# Patient Record
Sex: Female | Born: 1960 | Race: White | Hispanic: No | Marital: Married | Smoking: Never smoker
Health system: Southern US, Community
[De-identification: ages and names within clinical notes are randomized; demographics above are authoritative.]

## PROBLEM LIST (undated history)

## (undated) DIAGNOSIS — K5790 Diverticulosis of intestine, part unspecified, without perforation or abscess without bleeding: Secondary | ICD-10-CM

## (undated) DIAGNOSIS — Z8489 Family history of other specified conditions: Secondary | ICD-10-CM

## (undated) DIAGNOSIS — E785 Hyperlipidemia, unspecified: Secondary | ICD-10-CM

## (undated) DIAGNOSIS — R7303 Prediabetes: Secondary | ICD-10-CM

## (undated) DIAGNOSIS — I10 Essential (primary) hypertension: Secondary | ICD-10-CM

## (undated) HISTORY — PX: WISDOM TOOTH EXTRACTION: SHX21

---

## 2012-05-28 ENCOUNTER — Ambulatory Visit: Payer: Self-pay | Admitting: Nurse Practitioner

## 2012-06-19 ENCOUNTER — Ambulatory Visit: Payer: Self-pay | Admitting: Nurse Practitioner

## 2012-06-20 ENCOUNTER — Ambulatory Visit: Payer: Self-pay | Admitting: Nurse Practitioner

## 2012-07-18 ENCOUNTER — Ambulatory Visit: Payer: Self-pay | Admitting: Nurse Practitioner

## 2014-02-18 ENCOUNTER — Ambulatory Visit: Payer: Self-pay | Admitting: Family Medicine

## 2014-03-21 ENCOUNTER — Ambulatory Visit: Payer: Self-pay | Admitting: Gastroenterology

## 2014-03-21 HISTORY — PX: COLONOSCOPY: SHX174

## 2016-02-27 ENCOUNTER — Other Ambulatory Visit: Payer: Self-pay | Admitting: Family Medicine

## 2016-02-27 DIAGNOSIS — Z1231 Encounter for screening mammogram for malignant neoplasm of breast: Secondary | ICD-10-CM

## 2016-03-25 ENCOUNTER — Ambulatory Visit
Admission: RE | Admit: 2016-03-25 | Discharge: 2016-03-25 | Disposition: A | Discharge status: Home / Self Care | Payer: 59 | Source: Ambulatory Visit | Attending: Family Medicine | Admitting: Family Medicine

## 2016-03-25 ENCOUNTER — Encounter (HOSPITAL_COMMUNITY): Payer: Self-pay

## 2016-03-25 DIAGNOSIS — Z1231 Encounter for screening mammogram for malignant neoplasm of breast: Secondary | ICD-10-CM | POA: Diagnosis not present

## 2017-03-03 ENCOUNTER — Other Ambulatory Visit: Payer: Self-pay | Admitting: Family Medicine

## 2017-03-03 DIAGNOSIS — Z1231 Encounter for screening mammogram for malignant neoplasm of breast: Secondary | ICD-10-CM

## 2017-03-26 ENCOUNTER — Ambulatory Visit
Admission: RE | Admit: 2017-03-26 | Discharge: 2017-03-26 | Disposition: A | Discharge status: Home / Self Care | Payer: 59 | Source: Ambulatory Visit | Attending: Family Medicine | Admitting: Family Medicine

## 2017-03-26 DIAGNOSIS — Z1231 Encounter for screening mammogram for malignant neoplasm of breast: Secondary | ICD-10-CM | POA: Insufficient documentation

## 2018-04-08 ENCOUNTER — Other Ambulatory Visit: Payer: Self-pay | Admitting: Family Medicine

## 2018-04-08 DIAGNOSIS — Z1231 Encounter for screening mammogram for malignant neoplasm of breast: Secondary | ICD-10-CM

## 2018-04-10 ENCOUNTER — Ambulatory Visit
Admission: RE | Admit: 2018-04-10 | Discharge: 2018-04-10 | Disposition: A | Discharge status: Home / Self Care | Payer: Managed Care, Other (non HMO) | Source: Ambulatory Visit | Attending: Family Medicine | Admitting: Family Medicine

## 2018-04-10 DIAGNOSIS — Z1231 Encounter for screening mammogram for malignant neoplasm of breast: Secondary | ICD-10-CM | POA: Diagnosis not present

## 2018-04-15 ENCOUNTER — Other Ambulatory Visit: Payer: Self-pay | Admitting: Family Medicine

## 2018-04-15 DIAGNOSIS — N6320 Unspecified lump in the left breast, unspecified quadrant: Secondary | ICD-10-CM

## 2018-04-15 DIAGNOSIS — R928 Other abnormal and inconclusive findings on diagnostic imaging of breast: Secondary | ICD-10-CM

## 2018-04-23 ENCOUNTER — Ambulatory Visit
Admission: RE | Admit: 2018-04-23 | Discharge: 2018-04-23 | Disposition: A | Discharge status: Home / Self Care | Payer: Managed Care, Other (non HMO) | Source: Ambulatory Visit | Attending: Family Medicine | Admitting: Family Medicine

## 2018-04-23 DIAGNOSIS — R928 Other abnormal and inconclusive findings on diagnostic imaging of breast: Secondary | ICD-10-CM | POA: Insufficient documentation

## 2018-04-23 DIAGNOSIS — N6320 Unspecified lump in the left breast, unspecified quadrant: Secondary | ICD-10-CM

## 2019-12-03 IMAGING — MG DIGITAL DIAGNOSTIC UNILATERAL LEFT MAMMOGRAM WITH TOMO AND CAD
4 series · 4 of 12 positions shown · non-contrast
Comparison: Previous exam(s).

ACR Breast Density Category a: The breast tissue is almost entirely
fatty.

CLINICAL DATA: 57-year-old female for further evaluation of
possible LEFT breast mass on screening mammogram

EXAM:
DIGITAL DIAGNOSTIC LEFT MAMMOGRAM WITH TOMO
ULTRASOUND LEFT BREAST

[L CC synth-2D]
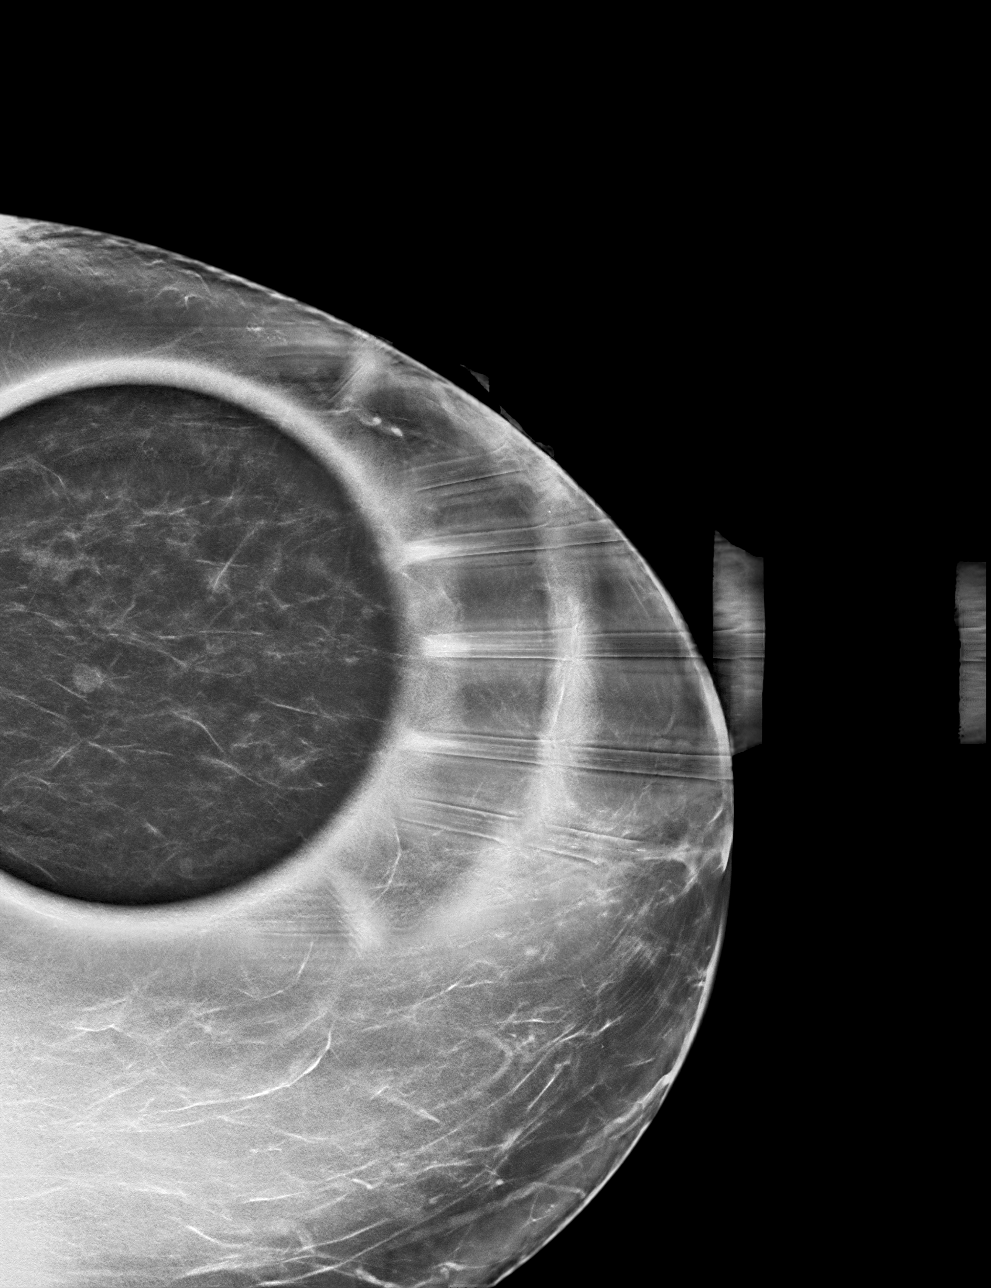

[L MLO synth-2D]
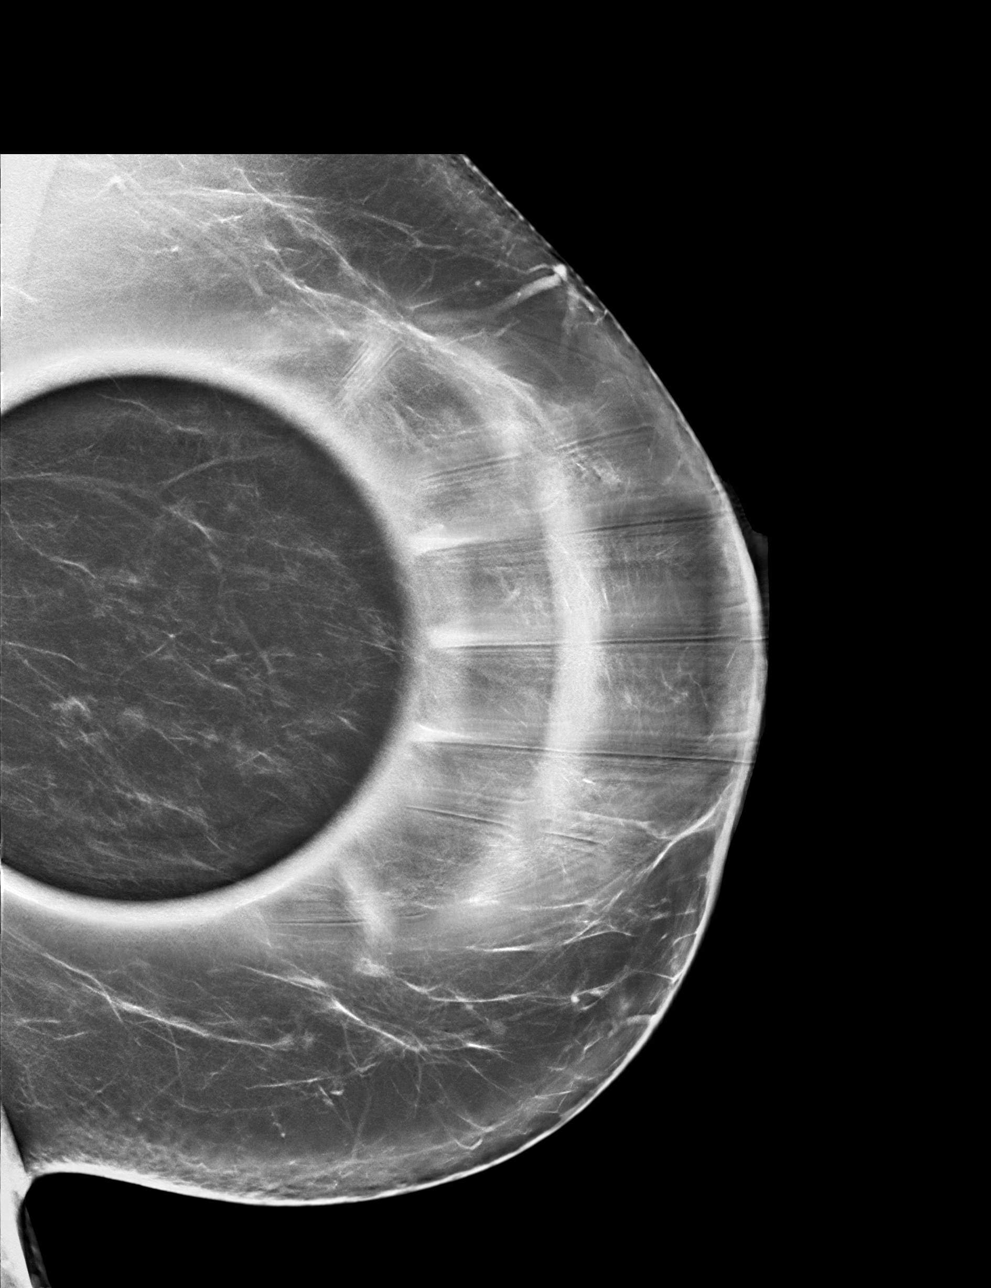

[L CC tomo · tomo slice 39/77.0]
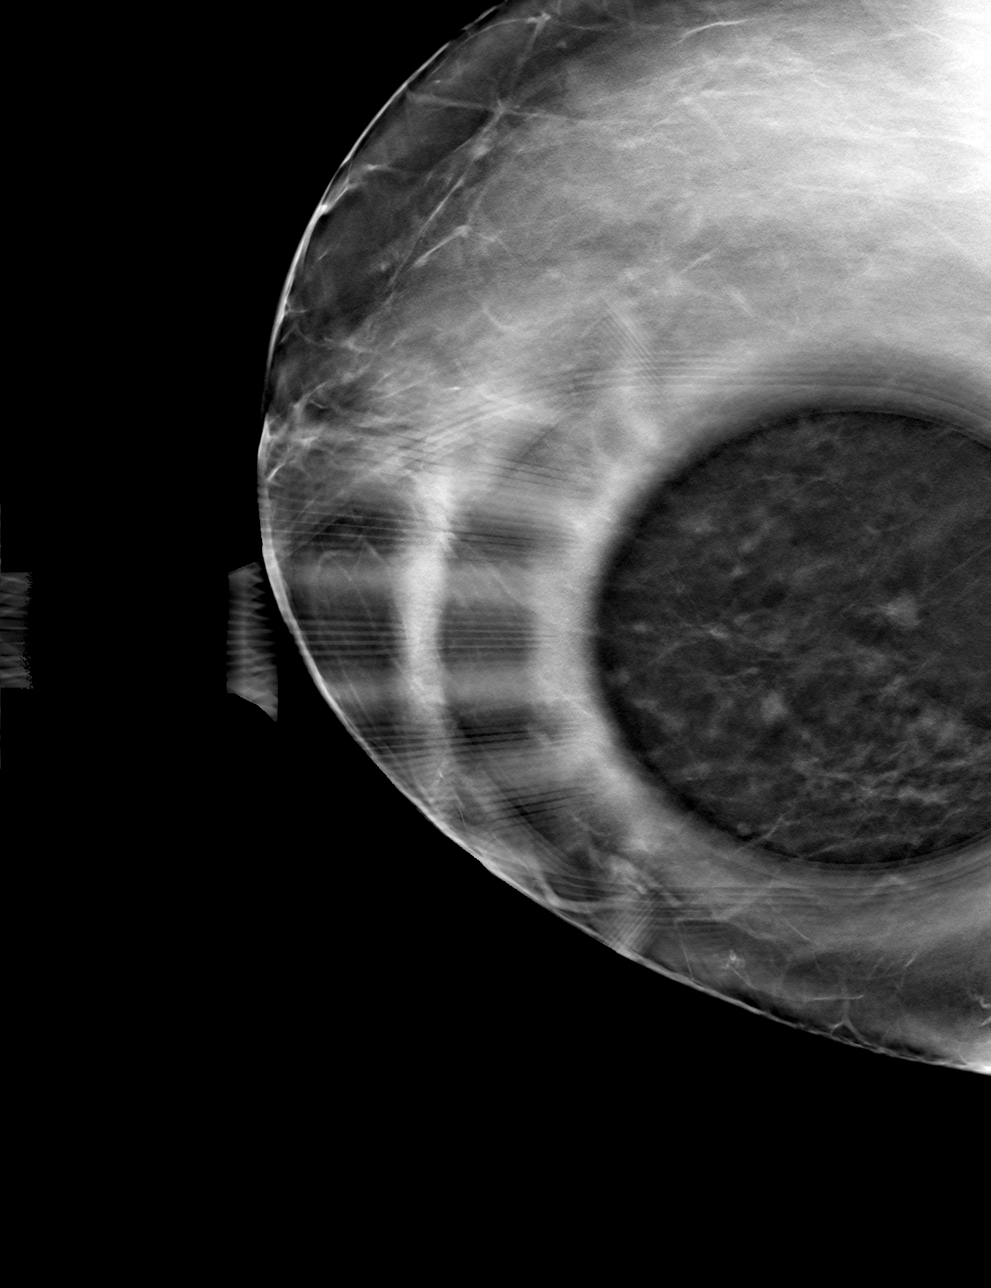

[L MLO tomo · tomo slice 41/80.0]
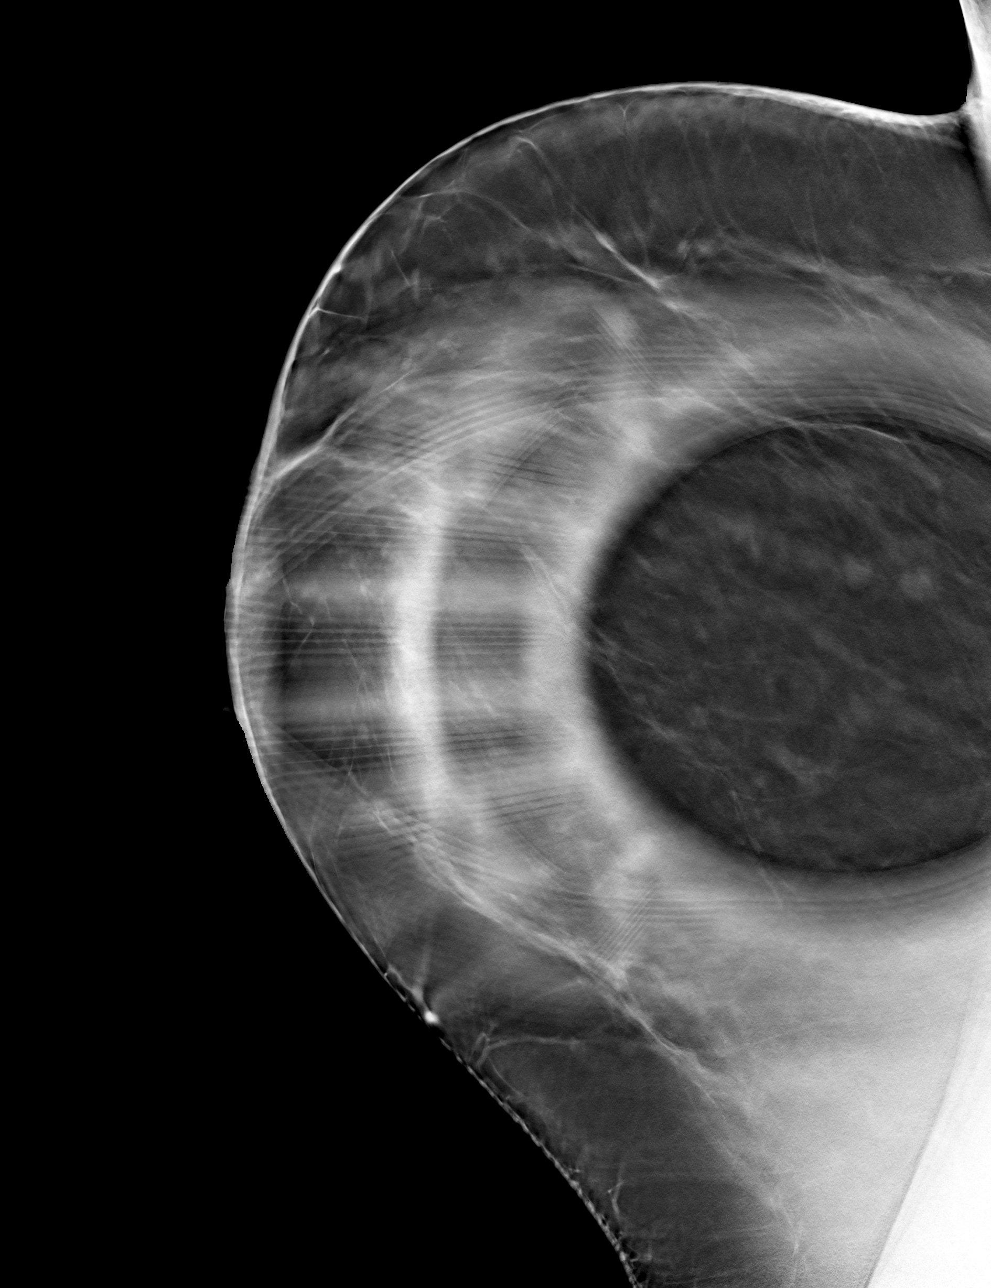

[4 of 12 positions shown; findings below may reference images not displayed]

FINDINGS: 2D/3D spot compression views of the LEFT breast demonstrate 2
adjacent circumscribed oval masses with possible fatty hila in the
OUTER LEFT breast.

Targeted ultrasound is performed, showing several benign appearing
intraparenchymal lymph nodes within the OUTER LEFT breast including
a 6 x 4 mm lymph node and a 4 x 3 mm lymph node at the 4 o'clock
position of the LEFT breast 6 cm from the nipple. No suspicious
solid mass, distortion or worrisome shadowing noted within the LEFT
breast.
IMPRESSION: Benign intraparenchymal lymph nodes within the OUTER LEFT breast,
likely representing the mammographic finding. Six-month follow-up
recommended to ensure stability.

RECOMMENDATION:
LEFT diagnostic mammogram with possible LEFT breast ultrasound in 6
months

I have discussed the findings and recommendations with the patient.
Results were also provided in writing at the conclusion of the
visit. If applicable, a reminder letter will be sent to the patient
regarding the next appointment.

BI-RADS CATEGORY  3: Probably benign.

## 2020-04-01 ENCOUNTER — Ambulatory Visit: Payer: Managed Care, Other (non HMO) | Attending: Internal Medicine

## 2020-04-01 DIAGNOSIS — Z23 Encounter for immunization: Secondary | ICD-10-CM

## 2020-04-01 NOTE — Progress Notes (Signed)
   Covid-19 Vaccination Clinic  Name:  Lisa Richards    MRN: [id number] DOB: [birth year]  04/01/2020  [name] was observed post Covid-19 immunization for 15 minutes without incident. She was provided with Vaccine Information Sheet and instruction to access the V-Safe system.   [name] was instructed to call 911 with any severe reactions post vaccine: Marland Kitchen Difficulty breathing  . Swelling of face and throat  . A fast heartbeat  . A bad rash all over body  . Dizziness and weakness   Immunizations Administered    Name Date Dose VIS Date Route   Pfizer COVID-19 Vaccine 04/01/2020 10:49 AM 0.3 mL 03/08/2020 Intramuscular   Manufacturer: [place]   Lot: [id number]   [place]: [id number]

## 2021-11-16 ENCOUNTER — Other Ambulatory Visit: Payer: Self-pay | Admitting: Family Medicine

## 2021-11-16 DIAGNOSIS — N63 Unspecified lump in unspecified breast: Secondary | ICD-10-CM

## 2021-12-11 ENCOUNTER — Ambulatory Visit
Admission: RE | Admit: 2021-12-11 | Discharge: 2021-12-11 | Disposition: A | Discharge status: Home / Self Care | Payer: Managed Care, Other (non HMO) | Source: Ambulatory Visit | Attending: Family Medicine | Admitting: Family Medicine

## 2021-12-11 DIAGNOSIS — N63 Unspecified lump in unspecified breast: Secondary | ICD-10-CM

## 2021-12-14 ENCOUNTER — Other Ambulatory Visit: Payer: Self-pay | Admitting: Family Medicine

## 2021-12-14 DIAGNOSIS — R928 Other abnormal and inconclusive findings on diagnostic imaging of breast: Secondary | ICD-10-CM

## 2021-12-14 DIAGNOSIS — N63 Unspecified lump in unspecified breast: Secondary | ICD-10-CM

## 2021-12-27 ENCOUNTER — Ambulatory Visit
Admission: RE | Admit: 2021-12-27 | Discharge: 2021-12-27 | Disposition: A | Discharge status: Home / Self Care | Payer: Managed Care, Other (non HMO) | Source: Ambulatory Visit | Attending: Family Medicine | Admitting: Family Medicine

## 2021-12-27 DIAGNOSIS — R928 Other abnormal and inconclusive findings on diagnostic imaging of breast: Secondary | ICD-10-CM

## 2021-12-27 DIAGNOSIS — N63 Unspecified lump in unspecified breast: Secondary | ICD-10-CM | POA: Diagnosis present

## 2021-12-27 HISTORY — PX: BREAST BIOPSY: SHX20

## 2021-12-28 LAB — SURGICAL PATHOLOGY

## 2021-12-31 ENCOUNTER — Encounter: Payer: Self-pay | Admitting: *Deleted

## 2021-12-31 NOTE — Progress Notes (Signed)
Referral recieved from [facility] for left breast atypical ductal hyperplasia.  Pt given several surgeon names and she will review and reach out to Dr. Baldemar Lenis for his recommendation.    I will call her tomorrow afternoon to see who she would like to be set up with.

## 2022-01-01 ENCOUNTER — Encounter: Payer: Self-pay | Admitting: *Deleted

## 2022-01-01 NOTE — Progress Notes (Signed)
Lisa Richards would like to see Dr. Bary Castilla, that appointment has been scheduled for 01/10/22 at 9:15.   She has been given appt. Details and instructions.   No further needs at this time.

## 2022-01-10 ENCOUNTER — Other Ambulatory Visit: Payer: Self-pay | Admitting: General Surgery

## 2022-01-10 DIAGNOSIS — N6321 Unspecified lump in the left breast, upper outer quadrant: Secondary | ICD-10-CM

## 2022-01-10 NOTE — Progress Notes (Signed)
Progress Notes - documented in this encounter Byrnett, Jeffrey William, MD - 01/10/2022 9:15 AM EDT Formatting of this note is different from the original. Subjective:   Patient ID: Lisa Richards is a 61 y.o. female.  HPI  The following portions of the patient's history were reviewed and updated as appropriate.  This a new patient is here today for: office visit. The patient has been referred by Dr. Babaoff for evaluation of atypical ductal hyperplasia in the left breast. Patient had a left breast biopsy done on 12-27-21. She reports the radiologist found a mass at the time of her 2019 mammogram and she decided to watch the area. Due to Covid she had a gap in mammograms between 2019 to 2023. Patient states she does self breast exams and denies any nipple discharge.   Chief Complaint  Patient presents with  Breast Mass    BP (!) 152/90  Pulse 108  Temp 36.1 C (97 F)  Ht 162.6 cm (5' 4")  Wt (!) 117.5 kg (259 lb)  SpO2 99%  BMI 44.46 kg/m   Past Medical History:  Diagnosis Date  Allergic state  BMI 45.0-49.9, adult (CMS-HCC) 05/19/12  Hypertension 11/13  Impaired fasting glucose 10/30/12  Menopausal state  Obesity    Past Surgical History:  Procedure Laterality Date  COLONOSCOPY 03/21/2014  Diverticulosis in the sigmoid colon/Repeat 10yrs/PYO  BREAST EXCISIONAL BIOPSY Left 12/27/2021  EXTRACTION TEETH    OB History   Gravida  2  Para  2  Term   Preterm   AB   Living     SAB   IAB   Ectopic   Molar   Multiple   Live Births     Obstetric Comments  Age at first period 13 Age of first pregnancy 30      Social History   Socioeconomic History  Marital status: Married  Tobacco Use  Smoking status: Never  Smokeless tobacco: Never  Substance and Sexual Activity  Alcohol use: Yes  Alcohol/week: 1.0 standard drink  Types: 1 Cans of beer per week  Comment: occasionally  Drug use: No  Sexual activity: Yes  Partners: Male  Birth  control/protection: Post-menopausal    Allergies  Allergen Reactions  Hydrocodone Hives, Itching and Swelling   Current Outpatient Medications  Medication Sig Dispense Refill  atorvastatin (LIPITOR) 10 MG tablet TAKE 1 TABLET BY MOUTH EVERY DAY 90 tablet 1  cetirizine (ZYRTEC) 10 MG tablet Take 10 mg by mouth once daily.  fluticasone propionate (FLONASE) 50 mcg/actuation nasal spray Place 2 sprays into both nostrils once daily  losartan (COZAAR) 25 MG tablet TAKE 1 TABLET BY MOUTH EVERY DAY 90 tablet 1  metoprolol-hydroCHLOROthiazide (LOPRESSOR HCT) 100-25 mg tablet TAKE 1 TABLET BY MOUTH EVERY DAY 90 tablet 1  olopatadine (PATADAY) 0.2 % ophthalmic solution Place 1 drop into both eyes once daily   No current facility-administered medications for this visit.   Family History  Problem Relation Age of Onset  Ovarian cancer Mother 77  Coronary Artery Disease (Blocked arteries around heart) Father  High blood pressure (Hypertension) Father  Cancer Sister  skin cancer  Coronary Artery Disease (Blocked arteries around heart) Maternal Grandmother  Coronary Artery Disease (Blocked arteries around heart) Maternal Grandfather  COPD Paternal Grandmother  Colon cancer Paternal Grandfather  Breast cancer Neg Hx   Labs and Radiology:   Left breast biopsy December 27, 2021:  A. BREAST, LEFT 2:00 10 CM FN; ULTRASOUND-GUIDED BIOPSY:  - ATYPICAL FIBROEPITHELIAL LESION, SEE COMMENT.   Comment:    Sections demonstrate cores of a fibroepithelial lesion with atypical  epithelial proliferation, pericanalicular architecture, and moderately  cellular fibrous stroma. IHC for CK5/6 and ER was performed. The  atypical epithelial cells are negative for CK5/6 with retained ER  expression.  The differential diagnosis for these findings includes atypical ductal  hyperplasia (ADH) involving a cellular fibroadenoma.   Imaging review:  April 10, 2018 through December 27, 2021 imaging studies were  reviewed. Small interval change over 3-1/2 years in the left upper outer quadrant lesion.  November 13, 2021 laboratory:  Globulin, Total - [facility] 1.5 - 4.5 g/dL 2.4  A/G Ratio - [facility] 1.2 - 2.2 1.9  Bilirubin Total - [facility] 0.0 - 1.2 mg/dL 0.4  Alkaline Phosphatase - [facility] 44 - 121 IU/L 91  AST (SGOT) - [facility] 0 - 40 IU/L 16  ALT (SGPT) - [facility] 0 - 32 IU/L 22  Cholesterol, Total - [facility] 100 - 199 mg/dL 171  Triglycerides - [facility] 0 - 149 mg/dL 182 High  Hdl Cholesterol - [facility] >39 mg/dL 45  VLDL Cholesterol Cal - [facility] 5 - 40 mg/dL 31  Low Density Lipoprotein - [facility] 0 - 99 mg/dL 95  LDL/HDL Ratio - [facility] 0.0 - 3.2 ratio 2.1   Glucose Random - [facility] 70 - 99 mg/dL 126 High  Blood Urea Nitrogen - [facility] 8 - 27 mg/dL 18  Creatinine - [facility] 0.57 - 1.00 mg/dL 0.91  EGFR (CKD-EPI 2021) - [facility] >59 mL/min/1.73 72  Bun/Creatinine Ratio - [facility] 12 - 28 20  Sodium - [facility] 134 - 144 mmol/L 141  Potassium - [facility] 3.5 - 5.2 mmol/L 4.5  Chloride - [facility] 96 - 106 mmol/L 99  Carbon Dioxide - [facility] 20 - 29 mmol/L 25  Calcium - [facility] 8.7 - 10.3 mg/dL 10.9 High  Protein Total - [facility] 6.0 - 8.5 g/dL 6.9  Albumin - [facility] 3.8 - 4.8 g/dL 4.5   TSH - [facility] 0.450 - 4.500 uIU/mL 0.664  T4, Total - [facility] 4.5 - 12.0 ug/dL 8.8  T3 Uptake Ratio - [facility] 24 - 39 % 24  Free Thyroxine Index (DMC) - [facility] 1.2 - 4.9 2.1  White Blood Cell Count - [facility] 3.4 - 10.8 x10E3/uL 8.7  Red Blood Cell Count - [facility] 3.77 - 5.28 x10E6/uL 4.86  Hemoglobin - [facility] 11.1 - 15.9 g/dL 14.1  Hematocrit - [facility] 34.0 - 46.6 % 42.2  MCV - [facility] 79 - 97 fL 87  MCH - [facility] 26.6 - 33.0 pg 29.0  MCHC - [facility] 31.5 - 35.7 g/dL 33.4  RDW - [facility] 11.7 - 15.4 % 12.8  Platelets - [facility] 150 - 450 x10E3/uL 291   January 10, 2022 left breast ultrasound:  The biopsy site is not clearly identified.   Review of Systems  Constitutional: Negative for chills and fever.   Respiratory: Negative for cough.    Objective:  Physical Exam Exam conducted with a chaperone present.  Constitutional:  Appearance: Normal appearance.  Cardiovascular:  Rate and Rhythm: Normal rate and regular rhythm.  Pulses: Normal pulses.  Heart sounds: Normal heart sounds.  Pulmonary:  Effort: Pulmonary effort is normal.  Breath sounds: Normal breath sounds.  Musculoskeletal:  Cervical back: Neck supple.  Skin: General: Skin is warm and dry.  Neurological:  Mental Status: She is alert and oriented to person, place, and time.  Psychiatric:  Mood and Affect: Mood normal.  Behavior: Behavior normal.    Assessment:   Small lesion within the left breast that   has shown minimal change over the last 3.5 years. ADH on biopsy.  Plan:   Indication for excision to evaluate for potential upstaging to DCIS, less likely invasive mammary carcinoma.  Has the area is not clearly identifiable on ultrasound today, wire localization would be appropriate.   This note is partially prepared by Michele Bailey, CMA acting as a scribe in the presence of Dr. Jeffrey Byrnett, MD.   The documentation recorded by the scribe accurately reflects the service I personally performed and the decisions made by me.   Jeffrey W. Byrnett, MD FACS   Electronically signed by Byrnett, Jeffrey William, MD at 01/10/2022 8:25 PM EDT 

## 2022-01-14 ENCOUNTER — Other Ambulatory Visit: Payer: Self-pay | Admitting: General Surgery

## 2022-01-14 DIAGNOSIS — N6321 Unspecified lump in the left breast, upper outer quadrant: Secondary | ICD-10-CM

## 2022-01-15 ENCOUNTER — Other Ambulatory Visit: Payer: Self-pay | Admitting: Family Medicine

## 2022-01-24 ENCOUNTER — Encounter
Admission: RE | Admit: 2022-01-24 | Discharge: 2022-01-24 | Disposition: A | Discharge status: Home / Self Care | Payer: Managed Care, Other (non HMO) | Source: Ambulatory Visit | Attending: General Surgery | Admitting: General Surgery

## 2022-01-24 VITALS — Ht 64.0 in | Wt 256.0 lb

## 2022-01-24 DIAGNOSIS — Z01812 Encounter for preprocedural laboratory examination: Secondary | ICD-10-CM

## 2022-01-24 DIAGNOSIS — I10 Essential (primary) hypertension: Secondary | ICD-10-CM

## 2022-01-24 HISTORY — DX: Essential (primary) hypertension: I10

## 2022-01-24 HISTORY — DX: Prediabetes: R73.03

## 2022-01-24 HISTORY — DX: Hyperlipidemia, unspecified: E78.5

## 2022-01-24 HISTORY — DX: Family history of other specified conditions: Z84.89

## 2022-01-24 HISTORY — DX: Diverticulosis of intestine, part unspecified, without perforation or abscess without bleeding: K57.90

## 2022-01-24 HISTORY — DX: Morbid (severe) obesity due to excess calories: E66.01

## 2022-01-24 NOTE — Patient Instructions (Addendum)
Your procedure is scheduled on: Monday, September 11 Report to the Registration Desk on the 1st floor of the [facility] at 8 am or the time your were given by Radiology Dept.  REMEMBER: Instructions that are not followed completely may result in serious medical risk, up to and including death; or upon the discretion of your surgeon and anesthesiologist your surgery may need to be rescheduled.  Do not eat food after midnight the night before surgery.  No gum chewing, lozengers or hard candies.  You may however, drink CLEAR liquids up to 2 hours before you are scheduled to arrive for your surgery. Do not drink anything within 2 hours of your scheduled arrival time.  Clear liquids include: - water  - apple juice without pulp - gatorade (not RED colors) - black coffee or tea (Do NOT add milk or creamers to the coffee or tea) Do NOT drink anything that is not on this list.  DO NOT TAKE ANY MEDICATIONS THE MORNING OF SURGERY  One week prior to surgery: starting today, September 7 Stop Anti-inflammatories (NSAIDS) such as Advil, Aleve, Ibuprofen, Motrin, Naproxen, Naprosyn and Aspirin based products such as Excedrin, Goodys Powder, BC Powder. Stop ANY OVER THE COUNTER supplements until after surgery. You may however, continue to take Tylenol if needed for pain up until the day of surgery.  No Alcohol for 24 hours before or after surgery.  No Smoking including e-cigarettes for 24 hours prior to surgery.  No chewable tobacco products for at least 6 hours prior to surgery.  No nicotine patches on the day of surgery.  Do not use any "recreational" drugs for at least a week prior to your surgery.  Please be advised that the combination of cocaine and anesthesia may have negative outcomes, up to and including death. If you test positive for cocaine, your surgery will be cancelled.  On the morning of surgery brush your teeth with toothpaste and water, you may rinse your mouth with mouthwash if  you wish. Do not swallow any toothpaste or mouthwash.  Use CHG Soap as directed on instruction sheet.  Do not wear jewelry, make-up, hairpins, clips or nail polish.  Do not wear lotions, powders, or perfumes.   Do not shave body from the neck down 48 hours prior to surgery just in case you cut yourself which could leave a site for infection.  Also, freshly shaved skin may become irritated if using the CHG soap.  Contact lenses, hearing aids and dentures may not be worn into surgery.  Do not bring valuables to the hospital. [facility] is not responsible for any missing/lost belongings or valuables.   Notify your doctor if there is any change in your medical condition (cold, fever, infection).  Wear comfortable clothing (specific to your surgery type) to the hospital.  After surgery, you can help prevent lung complications by doing breathing exercises.  Take deep breaths and cough every 1-2 hours. Your doctor may order a device called an Incentive Spirometer to help you take deep breaths.  If you are being discharged the day of surgery, you will not be allowed to drive home. You will need a responsible adult (18 years or older) to drive you home and stay with you that night.   If you are taking public transportation, you will need to have a responsible adult (18 years or older) with you. Please confirm with your physician that it is acceptable to use public transportation.   Please call the [facility] Dept.  at [phone] if you have any questions about these instructions.  Surgery Visitation Policy:  Patients undergoing a surgery or procedure may have two family members or support persons with them as long as the person is not COVID-19 positive or experiencing its symptoms.      Preparing for Surgery with [place] (CHG) Soap  Before surgery, you can play an important role by reducing the number of germs on your skin.  CHG (Chlorhexidine  gluconate) soap is an antiseptic cleanser which kills germs and bonds with the skin to continue killing germs even after washing.  Please do not use if you have an allergy to CHG or antibacterial soaps. If your skin becomes reddened/irritated stop using the CHG.  1. Shower the NIGHT BEFORE SURGERY and the MORNING OF SURGERY with CHG soap.  2. If you choose to wash your hair, wash your hair first as usual with your normal shampoo.  3. After shampooing, rinse your hair and body thoroughly to remove the shampoo.  4. Use CHG as you would any other liquid soap. You can apply CHG directly to the skin and wash gently with a scrungie or a clean washcloth.  5. Apply the CHG soap to your body only from the neck down. Do not use on open wounds or open sores. Avoid contact with your eyes, ears, mouth, and genitals (private parts). Wash face and genitals (private parts) with your normal soap.  6. Wash thoroughly, paying special attention to the area where your surgery will be performed.  7. Thoroughly rinse your body with warm water.  8. Do not shower/wash with your normal soap after using and rinsing off the CHG soap.  9. Pat yourself dry with a clean towel.  10. Wear clean pajamas to bed the night before surgery.  12. Place clean sheets on your bed the night of your first shower and do not sleep with pets.  13. Shower again with the CHG soap on the day of surgery prior to arriving at the hospital.  14. Do not apply any deodorants/lotions/powders.  15. Please wear clean clothes to the hospital.

## 2022-01-25 ENCOUNTER — Encounter: Payer: Self-pay | Admitting: Urgent Care

## 2022-01-25 ENCOUNTER — Encounter
Admission: RE | Admit: 2022-01-25 | Discharge: 2022-01-25 | Disposition: A | Discharge status: Home / Self Care | Payer: Managed Care, Other (non HMO) | Source: Ambulatory Visit | Attending: General Surgery

## 2022-01-25 DIAGNOSIS — I10 Essential (primary) hypertension: Secondary | ICD-10-CM | POA: Diagnosis not present

## 2022-01-25 DIAGNOSIS — Z01818 Encounter for other preprocedural examination: Secondary | ICD-10-CM | POA: Insufficient documentation

## 2022-01-25 DIAGNOSIS — Z01812 Encounter for preprocedural laboratory examination: Secondary | ICD-10-CM

## 2022-01-25 LAB — POTASSIUM: Potassium: 4.1 mmol/L (ref 3.5–5.1)

## 2022-01-28 ENCOUNTER — Ambulatory Visit: Payer: Managed Care, Other (non HMO) | Admitting: Certified Registered"

## 2022-01-28 ENCOUNTER — Encounter: Payer: Self-pay | Admitting: General Surgery

## 2022-01-28 ENCOUNTER — Ambulatory Visit
Admission: RE | Admit: 2022-01-28 | Discharge: 2022-01-28 | Disposition: A | Discharge status: Home / Self Care | Payer: Managed Care, Other (non HMO) | Source: Ambulatory Visit | Attending: General Surgery | Admitting: General Surgery

## 2022-01-28 ENCOUNTER — Other Ambulatory Visit: Payer: Self-pay

## 2022-01-28 ENCOUNTER — Encounter
Admission: RE | Disposition: A | Discharge status: Home / Self Care | Payer: Self-pay | Source: Ambulatory Visit | Attending: General Surgery

## 2022-01-28 ENCOUNTER — Other Ambulatory Visit: Payer: Self-pay | Admitting: General Surgery

## 2022-01-28 DIAGNOSIS — R7303 Prediabetes: Secondary | ICD-10-CM | POA: Diagnosis not present

## 2022-01-28 DIAGNOSIS — D242 Benign neoplasm of left breast: Secondary | ICD-10-CM | POA: Diagnosis not present

## 2022-01-28 DIAGNOSIS — I10 Essential (primary) hypertension: Secondary | ICD-10-CM | POA: Diagnosis not present

## 2022-01-28 DIAGNOSIS — N6321 Unspecified lump in the left breast, upper outer quadrant: Secondary | ICD-10-CM

## 2022-01-28 DIAGNOSIS — E785 Hyperlipidemia, unspecified: Secondary | ICD-10-CM | POA: Insufficient documentation

## 2022-01-28 DIAGNOSIS — N6092 Unspecified benign mammary dysplasia of left breast: Secondary | ICD-10-CM | POA: Insufficient documentation

## 2022-01-28 DIAGNOSIS — Z6841 Body Mass Index (BMI) 40.0 and over, adult: Secondary | ICD-10-CM | POA: Diagnosis not present

## 2022-01-28 HISTORY — PX: BREAST EXCISIONAL BIOPSY: SUR124

## 2022-01-28 HISTORY — PX: BREAST BIOPSY: SHX20

## 2022-01-28 SURGERY — BREAST BIOPSY WITH NEEDLE LOCALIZATION
Anesthesia: General | Laterality: Left

## 2022-01-28 MED ORDER — DEXAMETHASONE SODIUM PHOSPHATE 10 MG/ML IJ SOLN
INTRAMUSCULAR | Status: AC
  Filled 2022-01-28: qty 1

## 2022-01-28 MED ORDER — FENTANYL CITRATE (PF) 100 MCG/2ML IJ SOLN
INTRAMUSCULAR | Status: DC | PRN
  Administered 2022-01-28 (×2): 50 ug via INTRAVENOUS

## 2022-01-28 MED ORDER — PHENYLEPHRINE HCL-NACL 20-0.9 MG/250ML-% IV SOLN
INTRAVENOUS | Status: DC | PRN
  Administered 2022-01-28: 50 ug/min via INTRAVENOUS

## 2022-01-28 MED ORDER — ONDANSETRON HCL 4 MG/2ML IJ SOLN
INTRAMUSCULAR | Status: DC | PRN
  Administered 2022-01-28: 4 mg via INTRAVENOUS

## 2022-01-28 MED ORDER — LACTATED RINGERS IV SOLN: INTRAVENOUS | Status: DC

## 2022-01-28 MED ORDER — PROPOFOL 10 MG/ML IV BOLUS
INTRAVENOUS | Status: AC
  Filled 2022-01-28: qty 20

## 2022-01-28 MED ORDER — PROPOFOL 10 MG/ML IV BOLUS
INTRAVENOUS | Status: DC | PRN
  Administered 2022-01-28: 150 mg via INTRAVENOUS

## 2022-01-28 MED ORDER — MIDAZOLAM HCL 2 MG/2ML IJ SOLN
INTRAMUSCULAR | Status: DC | PRN
  Administered 2022-01-28: 2 mg via INTRAVENOUS

## 2022-01-28 MED ORDER — PHENYLEPHRINE HCL (PRESSORS) 10 MG/ML IV SOLN
INTRAVENOUS | Status: AC
  Filled 2022-01-28: qty 1

## 2022-01-28 MED ORDER — BUPIVACAINE-EPINEPHRINE (PF) 0.5% -1:200000 IJ SOLN
INTRAMUSCULAR | Status: AC
  Filled 2022-01-28: qty 30

## 2022-01-28 MED ORDER — CHLORHEXIDINE GLUCONATE 0.12 % MT SOLN
OROMUCOSAL | Status: AC
  Administered 2022-01-28: 15 mL via OROMUCOSAL
  Filled 2022-01-28: qty 15

## 2022-01-28 MED ORDER — DEXAMETHASONE SODIUM PHOSPHATE 10 MG/ML IJ SOLN
INTRAMUSCULAR | Status: DC | PRN
  Administered 2022-01-28: 10 mg via INTRAVENOUS

## 2022-01-28 MED ORDER — CHLORHEXIDINE GLUCONATE CLOTH 2 % EX PADS: 6.0000 | MEDICATED_PAD | Freq: Once | CUTANEOUS | Status: DC

## 2022-01-28 MED ORDER — CHLORHEXIDINE GLUCONATE 0.12 % MT SOLN: 15.0000 mL | Freq: Once | OROMUCOSAL | Status: AC

## 2022-01-28 MED ORDER — LIDOCAINE HCL (PF) 2 % IJ SOLN
INTRAMUSCULAR | Status: AC
  Filled 2022-01-28: qty 5

## 2022-01-28 MED ORDER — BUPIVACAINE-EPINEPHRINE (PF) 0.5% -1:200000 IJ SOLN
INTRAMUSCULAR | Status: DC | PRN
  Administered 2022-01-28: 20 mL

## 2022-01-28 MED ORDER — KETAMINE HCL 50 MG/5ML IJ SOSY
PREFILLED_SYRINGE | INTRAMUSCULAR | Status: AC
  Filled 2022-01-28: qty 5

## 2022-01-28 MED ORDER — FENTANYL CITRATE (PF) 100 MCG/2ML IJ SOLN: 25.0000 ug | INTRAMUSCULAR | Status: DC | PRN

## 2022-01-28 MED ORDER — MIDAZOLAM HCL 2 MG/2ML IJ SOLN
INTRAMUSCULAR | Status: AC
  Filled 2022-01-28: qty 2

## 2022-01-28 MED ORDER — ONDANSETRON HCL 4 MG/2ML IJ SOLN
INTRAMUSCULAR | Status: AC
  Filled 2022-01-28: qty 2

## 2022-01-28 MED ORDER — TRAMADOL HCL 50 MG PO TABS: 50.0000 mg | ORAL_TABLET | Freq: Four times a day (QID) | ORAL | 0 refills | Status: AC | PRN

## 2022-01-28 MED ORDER — PHENYLEPHRINE HCL (PRESSORS) 10 MG/ML IV SOLN
INTRAVENOUS | Status: DC | PRN
  Administered 2022-01-28: 40 ug via INTRAVENOUS
  Administered 2022-01-28: 80 ug via INTRAVENOUS
  Administered 2022-01-28: 160 ug via INTRAVENOUS

## 2022-01-28 MED ORDER — FAMOTIDINE 20 MG PO TABS
ORAL_TABLET | ORAL | Status: AC
  Administered 2022-01-28: 20 mg via ORAL
  Filled 2022-01-28: qty 1

## 2022-01-28 MED ORDER — KETAMINE HCL 10 MG/ML IJ SOLN
INTRAMUSCULAR | Status: DC | PRN
  Administered 2022-01-28: 20 mg via INTRAVENOUS
  Administered 2022-01-28: 30 mg via INTRAVENOUS

## 2022-01-28 MED ORDER — FAMOTIDINE 20 MG PO TABS: 20.0000 mg | ORAL_TABLET | Freq: Once | ORAL | Status: AC

## 2022-01-28 MED ORDER — FENTANYL CITRATE (PF) 100 MCG/2ML IJ SOLN
INTRAMUSCULAR | Status: AC
  Filled 2022-01-28: qty 2

## 2022-01-28 MED ORDER — ORAL CARE MOUTH RINSE: 15.0000 mL | Freq: Once | OROMUCOSAL | Status: AC

## 2022-01-28 SURGICAL SUPPLY — 44 items
APL PRP STRL LF DISP 70% ISPRP (MISCELLANEOUS) ×1
BLADE BOVIE TIP EXT 4 (BLADE) IMPLANT
BLADE SURG 15 STRL SS SAFETY (BLADE) ×2 IMPLANT
CHLORAPREP W/TINT 26 (MISCELLANEOUS) ×2 IMPLANT
CNTNR SPEC 2.5X3XGRAD LEK (MISCELLANEOUS)
CONT SPEC 4OZ STER OR WHT (MISCELLANEOUS)
CONT SPEC 4OZ STRL OR WHT (MISCELLANEOUS)
CONTAINER SPEC 2.5X3XGRAD LEK (MISCELLANEOUS) IMPLANT
COVER PROBE FLX POLY STRL (MISCELLANEOUS) ×1 IMPLANT
DEVICE DUBIN SPECIMEN MAMMOGRA (MISCELLANEOUS) ×1 IMPLANT
DRAPE LAPAROTOMY 100X77 ABD (DRAPES) ×1 IMPLANT
DRSG GAUZE FLUFF 36X18 (GAUZE/BANDAGES/DRESSINGS) ×1 IMPLANT
DRSG TELFA 3X4 N-ADH STERILE (GAUZE/BANDAGES/DRESSINGS) ×2 IMPLANT
ELECT CAUTERY BLADE TIP 2.5 (TIP) ×1
ELECT REM PT RETURN 9FT ADLT (ELECTROSURGICAL) ×1
ELECTRODE CAUTERY BLDE TIP 2.5 (TIP) ×1 IMPLANT
ELECTRODE REM PT RTRN 9FT ADLT (ELECTROSURGICAL) ×1 IMPLANT
GAUZE 4X4 16PLY ~~LOC~~+RFID DBL (SPONGE) ×1 IMPLANT
GLOVE BIO SURGEON STRL SZ7.5 (GLOVE) ×1 IMPLANT
GLOVE SURG UNDER LTX SZ8 (GLOVE) ×1 IMPLANT
GOWN STRL REUS W/ TWL LRG LVL3 (GOWN DISPOSABLE) ×2 IMPLANT
GOWN STRL REUS W/TWL LRG LVL3 (GOWN DISPOSABLE) ×2
KIT TURNOVER KIT A (KITS) ×1 IMPLANT
LABEL OR SOLS (LABEL) ×1 IMPLANT
MANIFOLD NEPTUNE II (INSTRUMENTS) ×1 IMPLANT
MARGIN MAP 10MM (MISCELLANEOUS) ×1 IMPLANT
NDL HYPO 25X1 1.5 SAFETY (NEEDLE) ×1 IMPLANT
NEEDLE HYPO 22GX1.5 SAFETY (NEEDLE) ×1 IMPLANT
NEEDLE HYPO 25X1 1.5 SAFETY (NEEDLE) ×1 IMPLANT
PACK BASIN MINOR ARMC (MISCELLANEOUS) ×1 IMPLANT
RETRACTOR RING XSMALL (MISCELLANEOUS) IMPLANT
RTRCTR WOUND ALEXIS 13CM XS SH (MISCELLANEOUS)
STRIP CLOSURE SKIN 1/2X4 (GAUZE/BANDAGES/DRESSINGS) ×1 IMPLANT
SUT ETHILON 3-0 FS-10 30 BLK (SUTURE) ×1
SUT VIC AB 2-0 CT1 27 (SUTURE) ×1
SUT VIC AB 2-0 CT1 TAPERPNT 27 (SUTURE) ×1 IMPLANT
SUT VIC AB 4-0 FS2 27 (SUTURE) ×1 IMPLANT
SUTURE EHLN 3-0 FS-10 30 BLK (SUTURE) ×1 IMPLANT
SWABSTK COMLB BENZOIN TINCTURE (MISCELLANEOUS) ×1 IMPLANT
SYR 10ML LL (SYRINGE) ×1 IMPLANT
TAPE TRANSPORE STRL 2 31045 (GAUZE/BANDAGES/DRESSINGS) IMPLANT
TRAP FLUID SMOKE EVACUATOR (MISCELLANEOUS) ×1 IMPLANT
WATER STERILE IRR 1000ML POUR (IV SOLUTION) ×1 IMPLANT
WATER STERILE IRR 500ML POUR (IV SOLUTION) ×1 IMPLANT

## 2022-01-28 NOTE — OR Nursing (Signed)
PER OR RM 8/TERRY, PER DR. BYRNETT, HE DOES NOT NEED TO SEE PT IN POSTOP PRIOR TO DISCHARGE.

## 2022-01-28 NOTE — Anesthesia Postprocedure Evaluation (Signed)
Anesthesia Post Note  Patient: Lisa Richards  Procedure(s) Performed: BREAST BIOPSY WITH NEEDLE LOCALIZATION (Left)  Patient location during evaluation: PACU Anesthesia Type: General Level of consciousness: awake and alert Pain management: pain level controlled Vital Signs Assessment: post-procedure vital signs reviewed and stable Respiratory status: spontaneous breathing, nonlabored ventilation, respiratory function stable and patient connected to nasal cannula oxygen Cardiovascular status: blood pressure returned to baseline and stable Postop Assessment: no apparent nausea or vomiting Anesthetic complications: no   No notable events documented.   Last Vitals:  Vitals:   01/28/22 1200 01/28/22 1215  BP: 111/64   Pulse: 98   Resp: 12   Temp:  (!) 36.1 C  SpO2: 95%     Last Pain:  Vitals:   01/28/22 1215  TempSrc:   PainSc: 0-No pain                 Precious Haws Ranae Casebier

## 2022-01-28 NOTE — Op Note (Signed)
Preoperative diagnosis: Atypical ductal hyperplasia of the left breast.  Postoperative diagnosis: Same.  Operative procedure: Left breast biopsy with wire localization.  Operating surgeon: Hervey Ard, MD.  Anesthesia: General by LMA, Marcaine 0.5% with 1: 200,000 units of epinephrine, 30 cc.  Estimated blood loss: 5 cc.  Clinical note: 61 year old woman had a longstanding lesion in the left breast recently biopsied showing evidence of ADH.  She is admitted at this time for biopsy to confirm no upstaging.  The patient underwent wire localization the morning of the procedure.  The case was reviewed with the radiologist by phone prior to surgery.  Antibiotics were not indicated.  SCD stockings for DVT prevention.  Operative note: The breast was taped medially to provide exposure of the upper outer quadrant.  With the patient under adequate general anesthesia the breast was cleansed with ChloraPrep and draped.  Attempts at ultrasound localization of the wire and documentation were unsuccessful due to plant printer failure.  Marcaine was infiltrated for postoperative analgesia.  A curvilinear incision in the upper outer quadrant of the breast was undertaken.  The skin was incised sharply remaining dissection with electrocautery.  The wire was brought into the wound.  The tissue deep to the wire was swept medially and a 2 x 3 x 6 cm block of tissue excised orientated and specimen radiograph obtained.  This showed the intact wire tip as well as the previously placed clip.  There was no clear-cut palpable mass, rather just some focal thickening which was thought to be breast parenchyma.  Hemostasis was electrocautery.  The deep tissue was approximated with 2 layers of 2-0 Vicryl figure-of-eight sutures.  The skin was closed with a running 4-0 Vicryl subcuticular suture.  Benzoin and Steri-Strips followed by Telfa, fluff gauze and a compressive wrap were applied.  Patient tolerated procedure well and  was taken to the PACU in stable condition.

## 2022-01-28 NOTE — H&P (Signed)
Lisa Richards [id number] 12/10/1960     HPI:  Patient with long standing lesion showing ADH.  For excisional biopsy.   Medications Prior to Admission  Medication Sig Dispense Refill Last Dose   atorvastatin (LIPITOR) 10 MG tablet Take 10 mg by mouth at bedtime.   01/27/2022   cetirizine (ZYRTEC) 10 MG tablet Take 10 mg by mouth daily.   01/27/2022   fluticasone (FLONASE) 50 MCG/ACT nasal spray Place 2 sprays into both nostrils daily.   01/27/2022   losartan (COZAAR) 25 MG tablet Take 25 mg by mouth at bedtime.   01/27/2022   metoprolol-hydrochlorothiazide (LOPRESSOR HCT) 100-25 MG tablet Take 1 tablet by mouth at bedtime.   01/27/2022   olopatadine (PATANOL) 0.1 % ophthalmic solution Place 1 drop into both eyes daily.   01/27/2022   Allergies  Allergen Reactions   Hydrocodone Hives   Past Medical History:  Diagnosis Date   Diverticulosis    Family history of adverse reaction to anesthesia    mother and sister had N/V   Hyperlipidemia    Hypertension    Morbid obesity with BMI of 40.0-44.9, adult (HCC)    Pre-diabetes    Past Surgical History:  Procedure Laterality Date   BREAST BIOPSY Left 12/27/2021   [place] Bx, Ribbon Clip, Path Pending   COLONOSCOPY  03/21/2014   WISDOM TOOTH EXTRACTION     Social History   Socioeconomic History   Marital status: Married    Spouse name: Lanny Hurst   Number of children: 2   Years of education: Not on file   Highest education level: Not on file  Occupational History   Not on file  Tobacco Use   Smoking status: Never   Smokeless tobacco: Never  Vaping Use   Vaping Use: Never used  Substance and Sexual Activity   Alcohol use: Yes    Comment: rarely   Drug use: Never   Sexual activity: Not on file  Other Topics Concern   Not on file  Social History Narrative   Not on file   Social Determinants of Health   Financial Resource Strain: Not on file  Food Insecurity: Not on file  Transportation Needs: Not on file  Physical Activity: Not on  file  Stress: Not on file  Social Connections: Not on file  Intimate Partner Violence: Not on file   Social History   Social History Narrative   Not on file     ROS: Negative.     PE: HEENT: Negative. Lungs: Clear. Cardio: RR.   Assessment/Plan:  Proceed with planned wire localization biopsy of the left breast. Forest Gleason [facility] 01/28/2022

## 2022-01-28 NOTE — Discharge Instructions (Signed)

## 2022-01-28 NOTE — Transfer of Care (Signed)
Immediate Anesthesia Transfer of Care Note  Patient: Lisa Richards  Procedure(s) Performed: BREAST BIOPSY WITH NEEDLE LOCALIZATION (Left)  Patient Location: PACU  Anesthesia Type:General  Level of Consciousness: drowsy  Airway & Oxygen Therapy: Patient Spontanous Breathing and Patient connected to face mask oxygen  Post-op Assessment: Report given to RN and Post -op Vital signs reviewed and stable  Post vital signs: Reviewed and stable  Last Vitals:  Vitals Value Taken Time  BP 99/64 01/28/22 1143  Temp 36.7 C 01/28/22 1143  Pulse 102 01/28/22 1145  Resp 22 01/28/22 1145  SpO2 98 % 01/28/22 1145  Vitals shown include unvalidated device data.  Last Pain:  Vitals:   01/28/22 0910  TempSrc: Temporal  PainSc: 2          Complications: No notable events documented.

## 2022-01-28 NOTE — Anesthesia Preprocedure Evaluation (Signed)
Anesthesia Evaluation  Patient identified by MRN, date of birth, ID band Patient awake    Reviewed: Allergy & Precautions, NPO status , Patient's Chart, lab work & pertinent test results  History of Anesthesia Complications (+) Family history of anesthesia reaction and history of anesthetic complications  Airway Mallampati: III  TM Distance: >3 FB Neck ROM: full    Dental  (+) Chipped   Pulmonary neg pulmonary ROS, neg shortness of breath,    Pulmonary exam normal        Cardiovascular Exercise Tolerance: Good hypertension, (-) angina(-) Past MI Normal cardiovascular exam     Neuro/Psych negative neurological ROS  negative psych ROS   GI/Hepatic negative GI ROS, Neg liver ROS, neg GERD  ,  Endo/Other  negative endocrine ROS  Renal/GU      Musculoskeletal   Abdominal   Peds  Hematology negative hematology ROS (+)   Anesthesia Other Findings Past Medical History: No date: Diverticulosis No date: Family history of adverse reaction to anesthesia     Comment:  mother and sister had N/V No date: Hyperlipidemia No date: Hypertension No date: Morbid obesity with BMI of 40.0-44.9, adult ([place]) No date: Pre-diabetes  Past Surgical History: 12/27/2021: BREAST BIOPSY; Left     Comment:  [place] Bx, Ribbon Clip, Path Pending 03/21/2014: COLONOSCOPY No date: WISDOM TOOTH EXTRACTION  BMI    Body Mass Index: 43.94 kg/m      Reproductive/Obstetrics negative OB ROS                             Anesthesia Physical Anesthesia Plan  ASA: 3  Anesthesia Plan: General LMA   Post-op Pain Management:    Induction: Intravenous  PONV Risk Score and Plan: Dexamethasone, Ondansetron, Midazolam and Treatment may vary due to age or medical condition  Airway Management Planned: LMA  Additional Equipment:   Intra-op Plan:   Post-operative Plan: Extubation in OR  Informed Consent: I have reviewed  the patients History and Physical, chart, labs and discussed the procedure including the risks, benefits and alternatives for the proposed anesthesia with the patient or authorized representative who has indicated his/her understanding and acceptance.     Dental Advisory Given  Plan Discussed with: Anesthesiologist, CRNA and Surgeon  Anesthesia Plan Comments: (Patient consented for risks of anesthesia including but not limited to:  - adverse reactions to medications - damage to eyes, teeth, lips or other oral mucosa - nerve damage due to positioning  - sore throat or hoarseness - Damage to heart, brain, nerves, lungs, other parts of body or loss of life  Patient voiced understanding.)        Anesthesia Quick Evaluation

## 2022-01-29 LAB — SURGICAL PATHOLOGY

## 2023-02-24 ENCOUNTER — Other Ambulatory Visit: Payer: Self-pay | Admitting: Family Medicine

## 2023-02-24 DIAGNOSIS — Z1231 Encounter for screening mammogram for malignant neoplasm of breast: Secondary | ICD-10-CM

## 2023-03-06 ENCOUNTER — Ambulatory Visit
Admission: RE | Admit: 2023-03-06 | Discharge: 2023-03-06 | Disposition: A | Discharge status: Home / Self Care | Payer: Managed Care, Other (non HMO) | Source: Ambulatory Visit | Attending: Family Medicine | Admitting: Family Medicine

## 2023-03-06 DIAGNOSIS — Z1231 Encounter for screening mammogram for malignant neoplasm of breast: Secondary | ICD-10-CM | POA: Diagnosis present

## 2024-02-23 ENCOUNTER — Other Ambulatory Visit: Payer: Self-pay | Admitting: Family Medicine

## 2024-02-23 DIAGNOSIS — Z1231 Encounter for screening mammogram for malignant neoplasm of breast: Secondary | ICD-10-CM

## 2024-03-22 ENCOUNTER — Ambulatory Visit
Admission: RE | Admit: 2024-03-22 | Discharge: 2024-03-22 | Disposition: A | Discharge status: Home / Self Care | Source: Ambulatory Visit | Attending: Family Medicine | Admitting: Family Medicine

## 2024-03-22 DIAGNOSIS — Z1231 Encounter for screening mammogram for malignant neoplasm of breast: Secondary | ICD-10-CM | POA: Diagnosis present

## 2024-06-09 ENCOUNTER — Encounter: Payer: Self-pay | Admitting: Gastroenterology

## 2024-06-09 ENCOUNTER — Encounter
Admission: RE | Disposition: A | Discharge status: Home / Self Care | Payer: Self-pay | Source: Home / Self Care | Attending: Gastroenterology

## 2024-06-09 ENCOUNTER — Ambulatory Visit: Admitting: Anesthesiology

## 2024-06-09 ENCOUNTER — Ambulatory Visit
Admission: RE | Admit: 2024-06-09 | Discharge: 2024-06-09 | Disposition: A | Discharge status: Home / Self Care | Attending: Gastroenterology | Admitting: Gastroenterology

## 2024-06-09 DIAGNOSIS — D123 Benign neoplasm of transverse colon: Secondary | ICD-10-CM | POA: Diagnosis not present

## 2024-06-09 DIAGNOSIS — K644 Residual hemorrhoidal skin tags: Secondary | ICD-10-CM | POA: Diagnosis not present

## 2024-06-09 DIAGNOSIS — I10 Essential (primary) hypertension: Secondary | ICD-10-CM | POA: Insufficient documentation

## 2024-06-09 DIAGNOSIS — Z1211 Encounter for screening for malignant neoplasm of colon: Secondary | ICD-10-CM | POA: Diagnosis present

## 2024-06-09 HISTORY — PX: HEMOSTASIS CLIP PLACEMENT: SHX6857

## 2024-06-09 HISTORY — PX: SUBMUCOSAL INJECTION: SHX5543

## 2024-06-09 HISTORY — PX: COLONOSCOPY: SHX5424

## 2024-06-09 HISTORY — PX: POLYPECTOMY: SHX149

## 2024-06-09 MED ORDER — PROPOFOL 10 MG/ML IV BOLUS
INTRAVENOUS | Status: DC | PRN
  Administered 2024-06-09: 70 mg via INTRAVENOUS
  Administered 2024-06-09: 100 ug/kg/min via INTRAVENOUS

## 2024-06-09 MED ORDER — LIDOCAINE HCL (CARDIAC) PF 100 MG/5ML IV SOSY
PREFILLED_SYRINGE | INTRAVENOUS | Status: DC | PRN
  Administered 2024-06-09: 100 mg via INTRAVENOUS

## 2024-06-09 MED ORDER — SODIUM CHLORIDE 0.9 % IV SOLN: INTRAVENOUS | Status: DC

## 2024-06-09 MED ORDER — PHENYLEPHRINE 80 MCG/ML (10ML) SYRINGE FOR IV PUSH (FOR BLOOD PRESSURE SUPPORT)
PREFILLED_SYRINGE | INTRAVENOUS | Status: AC
  Filled 2024-06-09: qty 10

## 2024-06-09 MED ORDER — PROPOFOL 1000 MG/100ML IV EMUL
INTRAVENOUS | Status: AC
  Filled 2024-06-09: qty 100

## 2024-06-09 MED ORDER — PHENYLEPHRINE HCL-NACL 20-0.9 MG/250ML-% IV SOLN
INTRAVENOUS | Status: AC
  Filled 2024-06-09: qty 250

## 2024-06-09 MED ORDER — LIDOCAINE HCL (PF) 2 % IJ SOLN
INTRAMUSCULAR | Status: AC
  Filled 2024-06-09: qty 5

## 2024-06-09 MED ORDER — PHENYLEPHRINE 80 MCG/ML (10ML) SYRINGE FOR IV PUSH (FOR BLOOD PRESSURE SUPPORT)
PREFILLED_SYRINGE | INTRAVENOUS | Status: DC | PRN
  Administered 2024-06-09: 80 ug via INTRAVENOUS

## 2024-06-09 NOTE — Op Note (Signed)
 [facility] Patient Name: Lisa Richards Procedure Date: 06/09/2024 6:58 AM MRN: [id number] Account #: [id number] Date of Birth: 1960/12/12 Admit Type: Outpatient Age: 64 Room: [facility] ENDO ROOM 4 Gender: Female Note Status: Finalized Instrument Name: Colon Scope [id number] Procedure:             Colonoscopy Indications:           Screening for colorectal malignant neoplasm, Last                         colonoscopy: November 2015, Last colonoscopy 10 years                         ago Providers:             Corinn Jess Brooklyn MD, MD Referring MD:          Jerona CHARLENA Sayre MD (Referring MD) Medicines:             General Anesthesia Complications:         No immediate complications. Estimated blood loss: None. Procedure:             Pre-Anesthesia Assessment:                        - Prior to the procedure, a History and Physical was                         performed, and patient medications and allergies were                         reviewed. The patient is competent. The risks and                         benefits of the procedure and the sedation options and                         risks were discussed with the patient. All questions                         were answered and informed consent was obtained.                         Patient identification and proposed procedure were                         verified by the physician, the nurse, the                         anesthesiologist, the anesthetist and the technician                         in the pre-procedure area in the procedure room in the                         endoscopy suite. Mental Status Examination: alert and                         oriented. Airway Examination: normal oropharyngeal  airway and neck mobility. Respiratory Examination:                         clear to auscultation. CV Examination: normal.                         Prophylactic Antibiotics: The patient does  not require                         prophylactic antibiotics. Prior Anticoagulants: The                         patient has taken no anticoagulant or antiplatelet                         agents. ASA Grade Assessment: II - A patient with mild                         systemic disease. After reviewing the risks and                         benefits, the patient was deemed in satisfactory                         condition to undergo the procedure. The anesthesia                         plan was to use general anesthesia. Immediately prior                         to administration of medications, the patient was                         re-assessed for adequacy to receive sedatives. The                         heart rate, respiratory rate, oxygen saturations,                         blood pressure, adequacy of pulmonary ventilation, and                         response to care were monitored throughout the                         procedure. The physical status of the patient was                         re-assessed after the procedure.                        After obtaining informed consent, the colonoscope was                         passed under direct vision. Throughout the procedure,                         the patient's blood pressure, pulse, and oxygen  saturations were monitored continuously. The                         Colonoscope was introduced through the anus and                         advanced to the the cecum, identified by appendiceal                         orifice and ileocecal valve. The colonoscopy was                         performed without difficulty. The patient tolerated                         the procedure well. The quality of the bowel                         preparation was evaluated using the BBPS [facility] Bowel                         Preparation Scale) with scores of: Right Colon = 3,                         Transverse Colon = 3 and Left Colon =  3 (entire mucosa                         seen well with no residual staining, small fragments                         of stool or opaque liquid). The total BBPS score                         equals 9. The ileocecal valve, appendiceal orifice,                         and rectum were photographed. Findings:      The perianal and digital rectal examinations were normal. Pertinent       negatives include normal sphincter tone and no palpable rectal lesions.      Two sessile polyps were found in the transverse colon. The polyps were 3       to 4 mm in size. These polyps were removed with a cold snare. Resection       and retrieval were complete. Estimated blood loss: none.      A 15 mm polyp was found in the transverse colon. The polyp was flat and       sessile. Preparations were made for mucosal resection. Demarcation of       the lesion was performed with narrow band imaging to clearly identify       the boundaries of the lesion. Eleview was injected to raise the lesion.       Snare mucosal resection was performed. Resection and retrieval were       complete. Resected tissue including tissue margins will be examined by       histology. To prevent bleeding after mucosal resection, two hemostatic       clips were successfully placed (MR safe). Clip manufacturer: [employer]  [employer]. There was no bleeding during, or at the end, of the       procedure. Estimated blood loss: none.      A 9 mm polyp was found in the transverse colon. The polyp was flat.       Preparations were made for mucosal resection. Demarcation of the lesion       was performed with narrow band imaging to clearly identify the       boundaries of the lesion. Eleview was injected to raise the lesion.       Snare mucosal resection was performed. Resection was complete, but the       tissue was not retrieved.      The retroflexed view of the distal rectum and anal verge was normal and       showed no anal or rectal  abnormalities.      Non-bleeding external hemorrhoids were found during retroflexion. The       hemorrhoids were medium-sized. Impression:            - Two 3 to 4 mm polyps in the transverse colon,                         removed with a cold snare. Resected and retrieved.                        - One 15 mm polyp in the transverse colon, removed                         with mucosal resection. Resected and retrieved. Clip                         manufacturer: [employer]. Clips (MR safe) were                         placed.                        - One 9 mm polyp in the transverse colon, removed with                         mucosal resection. Complete resection. Polyp tissue                         not retrieved.                        - The distal rectum and anal verge are normal on                         retroflexion view.                        - Non-bleeding external hemorrhoids.                        - Mucosal resection was performed. Resection and                         retrieval were complete.                        -  Mucosal resection was performed. Resection was                         complete, but the tissue was not retrieved. Recommendation:        - Discharge patient to home (with escort).                        - Resume previous diet today.                        - Continue present medications.                        - Await pathology results.                        - Repeat colonoscopy in 3 years for surveillance based                         on pathology results. Procedure Code(s):     --- Professional ---                        [phone], Colonoscopy, flexible; with endoscopic mucosal                         resection                        45385, 59, Colonoscopy, flexible; with removal of                         tumor(s), polyp(s), or other lesion(s) by snare                         technique Diagnosis Code(s):     --- Professional ---                         D12.3, Benign neoplasm of transverse colon (hepatic                         flexure or splenic flexure)                        Z12.11, Encounter for screening for malignant neoplasm                         of colon                        K64.4, Residual hemorrhoidal skin tags CPT copyright 2022 American Medical Association. All rights reserved. The codes documented in this report are preliminary and upon coder review may  be revised to meet current compliance requirements. Dr. Corinn Brooklyn Corinn Jess Brooklyn MD, MD 06/09/2024 8:07:09 AM This report has been signed electronically. Number of Addenda: 0 Note Initiated On: 06/09/2024 6:58 AM Scope Withdrawal Time: 0 hours 22 minutes 40 seconds  Total Procedure Duration: 0 hours 25 minutes 20 seconds  Estimated Blood Loss:  Estimated blood loss: none.      [facility]

## 2024-06-09 NOTE — Anesthesia Postprocedure Evaluation (Signed)
"   Anesthesia Post Note  Patient: Lisa Richards  Procedure(s) Performed: COLONOSCOPY POLYPECTOMY, INTESTINE CONTROL OF HEMORRHAGE, GI TRACT, ENDOSCOPIC, BY CLIPPING OR OVERSEWING  Patient location during evaluation: PACU Anesthesia Type: General Level of consciousness: awake and alert Pain management: satisfactory to patient Vital Signs Assessment: post-procedure vital signs reviewed and stable Respiratory status: nonlabored ventilation Cardiovascular status: stable Anesthetic complications: no   No notable events documented.   Last Vitals:  Vitals:   06/09/24 0809 06/09/24 0819  BP: 126/71 134/82  Pulse: 80 80  Resp: 15 17  Temp: (!) 36 C   SpO2: 100% 100%    Last Pain:  Vitals:   06/09/24 0809  TempSrc: Temporal  PainSc: 0-No pain                 VAN STAVEREN,Jahred Tatar      "

## 2024-06-09 NOTE — Transfer of Care (Signed)
 Immediate Anesthesia Transfer of Care Note  Patient: Lisa Richards  Procedure(s) Performed: COLONOSCOPY POLYPECTOMY, INTESTINE CONTROL OF HEMORRHAGE, GI TRACT, ENDOSCOPIC, BY CLIPPING OR OVERSEWING  Patient Location: PACU  Anesthesia Type:MAC  Level of Consciousness: awake, alert , and oriented  Airway & Oxygen Therapy: Patient Spontanous Breathing  Post-op Assessment: Report given to RN and Post -op Vital signs reviewed and stable  Post vital signs: stable  Last Vitals:  Vitals Value Taken Time  BP    Temp    Pulse    Resp    SpO2      Last Pain:  Vitals:   06/09/24 0706  TempSrc: Temporal  PainSc: 0-No pain         Complications: No notable events documented.

## 2024-06-09 NOTE — Anesthesia Preprocedure Evaluation (Signed)
"                                    Anesthesia Evaluation  Patient identified by MRN, date of birth, ID band Patient awake    Reviewed: Allergy & Precautions, NPO status , Patient's Chart, lab work & pertinent test results  Airway Mallampati: II  TM Distance: >3 FB Neck ROM: Full    Dental  (+) Teeth Intact   Pulmonary neg pulmonary ROS   Pulmonary exam normal        Cardiovascular Exercise Tolerance: Good hypertension, Pt. on medications negative cardio ROS Normal cardiovascular exam Rhythm:Regular Rate:Normal     Neuro/Psych negative neurological ROS  negative psych ROS   GI/Hepatic negative GI ROS, Neg liver ROS,,,  Endo/Other  negative endocrine ROS  Class 3 obesity  Renal/GU negative Renal ROS  negative genitourinary   Musculoskeletal   Abdominal  (+) + obese  Peds negative pediatric ROS (+)  Hematology negative hematology ROS (+)   Anesthesia Other Findings Past Medical History: No date: Diverticulosis No date: Family history of adverse reaction to anesthesia     Comment:  mother and sister had N/V No date: Hyperlipidemia No date: Hypertension No date: Morbid obesity with BMI of 40.0-44.9, adult ([facility]) No date: Pre-diabetes  Past Surgical History: 12/27/2021: BREAST BIOPSY; Left     Comment:  US  Bx, Ribbon Clip,neg 01/28/2022: BREAST BIOPSY; Left     Comment:  Procedure: BREAST BIOPSY WITH NEEDLE LOCALIZATION;                Surgeon: Dessa Reyes ORN, MD;  Location: [facility] ORS;                Service: General;  Laterality: Left; 01/28/2022: BREAST EXCISIONAL BIOPSY; Left     Comment:  neg 03/21/2014: COLONOSCOPY No date: WISDOM TOOTH EXTRACTION  BMI    Body Mass Index: 35.29 kg/m      Reproductive/Obstetrics negative OB ROS                              Anesthesia Physical Anesthesia Plan  ASA: 2  Anesthesia Plan: General   Post-op Pain Management:    Induction: Intravenous  PONV Risk Score  and Plan: Propofol  infusion and TIVA  Airway Management Planned: Natural Airway and Nasal Cannula  Additional Equipment:   Intra-op Plan:   Post-operative Plan:   Informed Consent: I have reviewed the patients History and Physical, chart, labs and discussed the procedure including the risks, benefits and alternatives for the proposed anesthesia with the patient or authorized representative who has indicated his/her understanding and acceptance.     Dental Advisory Given  Plan Discussed with: CRNA  Anesthesia Plan Comments:         Anesthesia Quick Evaluation  "

## 2024-06-09 NOTE — H&P (Signed)
 "   Lisa JONELLE Brooklyn, MD [facility], [facility] [address]  [place], [place] [address]  Main: [phone] Fax:  [phone] Pager: [phone]   Primary Care Physician:  Diedra Lame, MD Primary Gastroenterologist:  Dr. Corinn JONELLE Richards  Pre-Procedure History & Physical: HPI:  Lisa Richards is a 64 y.o. female is here for an colonoscopy.   Past Medical History:  Diagnosis Date   Diverticulosis    Family history of adverse reaction to anesthesia    mother and sister had N/V   Hyperlipidemia    Hypertension    Morbid obesity with BMI of 40.0-44.9, adult [facility])    Pre-diabetes     Past Surgical History:  Procedure Laterality Date   BREAST BIOPSY Left 12/27/2021   US  Bx, Ribbon Clip,neg   BREAST BIOPSY Left 01/28/2022   Procedure: BREAST BIOPSY WITH NEEDLE LOCALIZATION;  Surgeon: Dessa Reyes ORN, MD;  Location: [facility] ORS;  Service: General;  Laterality: Left;   BREAST EXCISIONAL BIOPSY Left 01/28/2022   neg   COLONOSCOPY  03/21/2014   WISDOM TOOTH EXTRACTION      Prior to Admission medications  Medication Sig Start Date End Date Taking? Authorizing Provider  atorvastatin (LIPITOR) 10 MG tablet Take 10 mg by mouth at bedtime.   Yes [provider]  cetirizine (ZYRTEC) 10 MG tablet Take 10 mg by mouth daily.   Yes [provider]  fluticasone (FLONASE) 50 MCG/ACT nasal spray Place 2 sprays into both nostrils daily.   Yes [provider]  losartan (COZAAR) 25 MG tablet Take 25 mg by mouth at bedtime.   Yes [provider]  metoprolol-hydrochlorothiazide (LOPRESSOR HCT) 100-25 MG tablet Take 1 tablet by mouth at bedtime.   Yes [provider]  olopatadine (PATANOL) 0.1 % ophthalmic solution Place 1 drop into both eyes daily.   Yes [provider]    Allergies as of 06/02/2024 - Review Complete 01/28/2022  Allergen Reaction Noted   Hydrocodone Hives 04/01/2020    Family History  Problem  Relation Age of Onset   Breast cancer Neg Hx     Social History   Socioeconomic History   Marital status: Married    Spouse name: Francis   Number of children: 2   Years of education: Not on file   Highest education level: Not on file  Occupational History   Not on file  Tobacco Use   Smoking status: Never   Smokeless tobacco: Never  Vaping Use   Vaping status: Never Used  Substance and Sexual Activity   Alcohol use: Yes    Comment: rarely   Drug use: Never   Sexual activity: Not on file  Other Topics Concern   Not on file  Social History Narrative   Not on file   Social Drivers of Health   Tobacco Use: Low Risk (06/09/2024)   Patient History    Smoking Tobacco Use: Never    Smokeless Tobacco Use: Never    Passive Exposure: Not on file  Financial Resource Strain: Low Risk  (05/28/2024)   Received from [facility]   Overall Financial Resource Strain (CARDIA)    Difficulty of Paying Living Expenses: Not hard at all  Food Insecurity: No Food Insecurity (05/28/2024)   Received from [facility]   Epic    Within the past 12 months, you worried that your food would run out before you got the money to buy more.: Never true    Within  the past 12 months, the food you bought just didn't last and you didn't have money to get more.: Never true  Transportation Needs: No Transportation Needs (05/28/2024)   Received from [facility] - Transportation    In the past 12 months, has lack of transportation kept you from medical appointments or from getting medications?: No    Lack of Transportation (Non-Medical): No  Physical Activity: Not on file  Stress: Not on file  Social Connections: Not on file  Intimate Partner Violence: Not on file  Depression (EYV7-0): Not on file  Alcohol Screen: Not on file  Housing: Low Risk  (05/28/2024)   Received from [facility]   Epic    In the last 12 months, was there a  time when you were not able to pay the mortgage or rent on time?: No    In the past 12 months, how many times have you moved where you were living?: 1    At any time in the past 12 months, were you homeless or living in a shelter (including now)?: No  Utilities: Not At Risk (05/28/2024)   Received from [facility]   Epic    In the past 12 months has the electric, gas, oil, or water company threatened to shut off services in your home?: No  Health Literacy: Not on file    Review of Systems: See HPI, otherwise negative ROS  Physical Exam: BP (!) 181/103   Pulse (!) 102   Temp (!) 97.3 F (36.3 C) (Temporal)   Resp 18   Ht 5' 3.5 (1.613 m)   Wt 91.8 kg   SpO2 99%   BMI 35.29 kg/m  General:   Alert,  pleasant and cooperative in NAD Head:  Normocephalic and atraumatic. Neck:  Supple; no masses or thyromegaly. Lungs:  Clear throughout to auscultation.    Heart:  Regular rate and rhythm. Abdomen:  Soft, nontender and nondistended. Normal bowel sounds, without guarding, and without rebound.   Neurologic:  Alert and  oriented x4;  grossly normal neurologically.  Impression/Plan: Lisa Richards is here for an colonoscopy to be performed for colon cancer screening  Risks, benefits, limitations, and alternatives regarding  colonoscopy have been reviewed with the patient.  Questions have been answered.  All parties agreeable.   Lisa Brooklyn, MD  06/09/2024, 7:31 AM "

## 2024-06-10 LAB — SURGICAL PATHOLOGY

## 2024-06-16 ENCOUNTER — Ambulatory Visit: Payer: Self-pay | Admitting: Gastroenterology
